# Patient Record
Sex: Female | Born: 1980 | Race: Black or African American | Hispanic: No | Marital: Married | Smoking: Current every day smoker
Health system: Southern US, Community
[De-identification: ages and names within clinical notes are randomized; demographics above are authoritative.]

---

## 2006-03-20 ENCOUNTER — Emergency Department: Payer: Self-pay | Admitting: Emergency Medicine

## 2006-10-19 ENCOUNTER — Emergency Department: Payer: Self-pay | Admitting: Emergency Medicine

## 2006-11-07 ENCOUNTER — Observation Stay: Payer: Self-pay | Admitting: Obstetrics and Gynecology

## 2007-02-13 ENCOUNTER — Observation Stay: Payer: Self-pay | Admitting: Obstetrics & Gynecology

## 2007-03-03 ENCOUNTER — Inpatient Hospital Stay: Payer: Self-pay

## 2007-08-09 ENCOUNTER — Emergency Department: Payer: Self-pay | Admitting: Emergency Medicine

## 2007-12-12 ENCOUNTER — Emergency Department: Payer: Self-pay | Admitting: Emergency Medicine

## 2008-01-12 ENCOUNTER — Emergency Department: Payer: Self-pay | Admitting: Emergency Medicine

## 2008-03-05 ENCOUNTER — Emergency Department: Payer: Self-pay | Admitting: Internal Medicine

## 2008-03-25 ENCOUNTER — Emergency Department: Payer: Self-pay | Admitting: Emergency Medicine

## 2008-10-12 ENCOUNTER — Emergency Department: Payer: Self-pay | Admitting: Emergency Medicine

## 2008-11-04 ENCOUNTER — Emergency Department: Payer: Self-pay | Admitting: Emergency Medicine

## 2009-01-12 ENCOUNTER — Emergency Department: Payer: Self-pay | Admitting: Unknown Physician Specialty

## 2009-03-30 ENCOUNTER — Emergency Department: Payer: Self-pay | Admitting: Emergency Medicine

## 2010-05-19 ENCOUNTER — Emergency Department: Payer: Self-pay | Admitting: Unknown Physician Specialty

## 2010-09-21 ENCOUNTER — Emergency Department: Payer: Self-pay | Admitting: Emergency Medicine

## 2010-09-23 ENCOUNTER — Emergency Department: Payer: Self-pay | Admitting: Internal Medicine

## 2011-01-05 ENCOUNTER — Emergency Department: Payer: Self-pay | Admitting: Emergency Medicine

## 2011-01-31 ENCOUNTER — Emergency Department: Payer: Self-pay | Admitting: Emergency Medicine

## 2011-07-10 ENCOUNTER — Observation Stay: Payer: Self-pay

## 2011-07-10 LAB — URINALYSIS, COMPLETE
Bilirubin,UR: NEGATIVE
Protein: NEGATIVE
Specific Gravity: 1.021 (ref 1.003–1.030)
Squamous Epithelial: 1

## 2011-08-12 ENCOUNTER — Observation Stay: Payer: Self-pay

## 2011-08-12 LAB — PIH PROFILE
Calcium, Total: 8.3 mg/dL — ABNORMAL LOW (ref 8.5–10.1)
Chloride: 106 mmol/L (ref 98–107)
Co2: 22 mmol/L (ref 21–32)
Creatinine: 0.58 mg/dL — ABNORMAL LOW (ref 0.60–1.30)
Glucose: 72 mg/dL (ref 65–99)
HCT: 34.3 % — ABNORMAL LOW (ref 35.0–47.0)
HGB: 11.6 g/dL — ABNORMAL LOW (ref 12.0–16.0)
MCH: 27.5 pg (ref 26.0–34.0)
MCHC: 33.9 g/dL (ref 32.0–36.0)
MCV: 81 fL (ref 80–100)
Osmolality: 269 (ref 275–301)
Platelet: 158 10*3/uL (ref 150–440)
Potassium: 3.7 mmol/L (ref 3.5–5.1)
RDW: 13.8 % (ref 11.5–14.5)
Uric Acid: 3.6 mg/dL (ref 2.6–6.0)

## 2011-08-12 LAB — PROTEIN / CREATININE RATIO, URINE
Creatinine, Urine: 180.8 mg/dL — ABNORMAL HIGH (ref 30.0–125.0)
Protein/Creat. Ratio: 100 mg/gCREAT (ref 0–200)

## 2011-08-24 ENCOUNTER — Inpatient Hospital Stay: Payer: Self-pay

## 2011-08-24 LAB — PIH PROFILE
Anion Gap: 12 (ref 7–16)
Calcium, Total: 8.3 mg/dL — ABNORMAL LOW (ref 8.5–10.1)
Creatinine: 0.67 mg/dL (ref 0.60–1.30)
EGFR (African American): >60
EGFR (Non-African Amer.): >60
Glucose: 105 mg/dL — ABNORMAL HIGH (ref 65–99)
HGB: 11.7 g/dL — ABNORMAL LOW (ref 12.0–16.0)
MCH: 26.9 pg (ref 26.0–34.0)
MCHC: 33.4 g/dL (ref 32.0–36.0)
MCV: 81 fL (ref 80–100)
Osmolality: 278 (ref 275–301)
Platelet: 146 10*3/uL — ABNORMAL LOW (ref 150–440)
Potassium: 3.6 mmol/L (ref 3.5–5.1)
RBC: 4.34 10*6/uL (ref 3.80–5.20)
RDW: 14.3 % (ref 11.5–14.5)
SGOT(AST): 20 U/L (ref 15–37)
WBC: 13.3 10*3/uL — ABNORMAL HIGH (ref 3.6–11.0)

## 2011-08-24 LAB — PROTEIN / CREATININE RATIO, URINE
Creatinine, Urine: 218 mg/dL — ABNORMAL HIGH (ref 30.0–125.0)
Protein, Random Urine: 41 mg/dL — ABNORMAL HIGH (ref 0–12)
Protein/Creat. Ratio: 188 mg/gCREAT (ref 0–200)

## 2011-08-27 LAB — PATHOLOGY REPORT

## 2012-09-16 ENCOUNTER — Emergency Department: Payer: Self-pay | Admitting: Internal Medicine

## 2012-09-30 ENCOUNTER — Emergency Department: Payer: Self-pay | Admitting: Emergency Medicine

## 2012-09-30 LAB — URINALYSIS, COMPLETE
Bilirubin,UR: NEGATIVE
Glucose,UR: NEGATIVE mg/dL (ref 0–75)
Hyaline Cast: 2
Squamous Epithelial: 10

## 2012-09-30 LAB — GC/CHLAMYDIA PROBE AMP

## 2012-09-30 LAB — WET PREP, GENITAL

## 2012-09-30 LAB — PREGNANCY, URINE: Pregnancy Test, Urine: NEGATIVE m[IU]/mL

## 2012-10-04 LAB — URINE CULTURE

## 2013-03-15 ENCOUNTER — Emergency Department: Payer: Self-pay | Admitting: Emergency Medicine

## 2013-03-26 ENCOUNTER — Emergency Department: Payer: Self-pay | Admitting: Emergency Medicine

## 2013-03-26 LAB — GC/CHLAMYDIA PROBE AMP

## 2013-03-26 LAB — URINALYSIS, COMPLETE
Bilirubin,UR: NEGATIVE
Blood: NEGATIVE
Glucose,UR: NEGATIVE mg/dL (ref 0–75)
Ketone: NEGATIVE
Nitrite: NEGATIVE
PH: 6 (ref 4.5–8.0)
Protein: NEGATIVE
RBC,UR: 3 /HPF (ref 0–5)
SPECIFIC GRAVITY: 1.027 (ref 1.003–1.030)
Squamous Epithelial: 2
WBC UR: 6 /HPF (ref 0–5)

## 2013-03-26 LAB — PREGNANCY, URINE: PREGNANCY TEST, URINE: NEGATIVE m[IU]/mL

## 2013-03-26 LAB — WET PREP, GENITAL

## 2013-03-28 LAB — URINE CULTURE

## 2013-04-28 ENCOUNTER — Emergency Department: Payer: Self-pay | Admitting: Emergency Medicine

## 2013-04-28 LAB — URINALYSIS, COMPLETE
Bilirubin,UR: NEGATIVE
GLUCOSE, UR: NEGATIVE mg/dL (ref 0–75)
Nitrite: NEGATIVE
PH: 6 (ref 4.5–8.0)
Protein: 100
Specific Gravity: 1.025 (ref 1.003–1.030)
Squamous Epithelial: >30

## 2013-04-28 LAB — COMPREHENSIVE METABOLIC PANEL
ALBUMIN: 3.6 g/dL (ref 3.4–5.0)
ALK PHOS: 50 U/L
ANION GAP: 7 (ref 7–16)
BILIRUBIN TOTAL: 0.5 mg/dL (ref 0.2–1.0)
BUN: 12 mg/dL (ref 7–18)
CALCIUM: 8.3 mg/dL — AB (ref 8.5–10.1)
CO2: 24 mmol/L (ref 21–32)
Chloride: 106 mmol/L (ref 98–107)
Creatinine: 0.8 mg/dL (ref 0.60–1.30)
EGFR (African American): >60
EGFR (Non-African Amer.): >60
Glucose: 120 mg/dL — ABNORMAL HIGH (ref 65–99)
Osmolality: 275 (ref 275–301)
POTASSIUM: 3.3 mmol/L — AB (ref 3.5–5.1)
SGOT(AST): 14 U/L — ABNORMAL LOW (ref 15–37)
SGPT (ALT): 20 U/L (ref 12–78)
Sodium: 137 mmol/L (ref 136–145)
TOTAL PROTEIN: 7 g/dL (ref 6.4–8.2)

## 2013-04-28 LAB — CBC
HCT: 38.4 % (ref 35.0–47.0)
HGB: 12.8 g/dL (ref 12.0–16.0)
MCH: 26.8 pg (ref 26.0–34.0)
MCHC: 33.2 g/dL (ref 32.0–36.0)
MCV: 81 fL (ref 80–100)
Platelet: 109 10*3/uL — ABNORMAL LOW (ref 150–440)
RBC: 4.76 10*6/uL (ref 3.80–5.20)
RDW: 14.8 % — ABNORMAL HIGH (ref 11.5–14.5)
WBC: 4.2 10*3/uL (ref 3.6–11.0)

## 2013-04-28 LAB — LIPASE, BLOOD: Lipase: 131 U/L (ref 73–393)

## 2013-04-28 LAB — TROPONIN I

## 2013-04-28 LAB — RAPID INFLUENZA A&B ANTIGENS

## 2013-04-30 LAB — URINE CULTURE

## 2013-05-31 ENCOUNTER — Emergency Department: Payer: Self-pay | Admitting: Emergency Medicine

## 2013-06-10 ENCOUNTER — Emergency Department: Payer: Self-pay | Admitting: Emergency Medicine

## 2013-06-10 LAB — COMPREHENSIVE METABOLIC PANEL
ANION GAP: 2 — AB (ref 7–16)
AST: 20 U/L (ref 15–37)
Albumin: 4.2 g/dL (ref 3.4–5.0)
Alkaline Phosphatase: 64 U/L
BUN: 14 mg/dL (ref 7–18)
Bilirubin,Total: 0.3 mg/dL (ref 0.2–1.0)
CO2: 29 mmol/L (ref 21–32)
Calcium, Total: 8.6 mg/dL (ref 8.5–10.1)
Chloride: 106 mmol/L (ref 98–107)
Creatinine: 0.74 mg/dL (ref 0.60–1.30)
EGFR (African American): >60
EGFR (Non-African Amer.): >60
GLUCOSE: 136 mg/dL — AB (ref 65–99)
Osmolality: 276 (ref 275–301)
POTASSIUM: 3.8 mmol/L (ref 3.5–5.1)
SGPT (ALT): 19 U/L (ref 12–78)
SODIUM: 137 mmol/L (ref 136–145)
TOTAL PROTEIN: 7.9 g/dL (ref 6.4–8.2)

## 2013-06-10 LAB — CBC WITH DIFFERENTIAL/PLATELET
Basophil #: 0 10*3/uL (ref 0.0–0.1)
Basophil %: 0.4 %
Eosinophil #: 0 10*3/uL (ref 0.0–0.7)
Eosinophil %: 0.2 %
HCT: 40.3 % (ref 35.0–47.0)
HGB: 12.7 g/dL (ref 12.0–16.0)
LYMPHS ABS: 0.9 10*3/uL — AB (ref 1.0–3.6)
LYMPHS PCT: 7.8 %
MCH: 26 pg (ref 26.0–34.0)
MCHC: 31.6 g/dL — ABNORMAL LOW (ref 32.0–36.0)
MCV: 82 fL (ref 80–100)
MONO ABS: 0.4 x10 3/mm (ref 0.2–0.9)
MONOS PCT: 3.3 %
Neutrophil #: 10.3 10*3/uL — ABNORMAL HIGH (ref 1.4–6.5)
Neutrophil %: 88.3 %
Platelet: 219 10*3/uL (ref 150–440)
RBC: 4.89 10*6/uL (ref 3.80–5.20)
RDW: 14.7 % — AB (ref 11.5–14.5)
WBC: 11.6 10*3/uL — ABNORMAL HIGH (ref 3.6–11.0)

## 2013-06-10 LAB — URINALYSIS, COMPLETE
BILIRUBIN, UR: NEGATIVE
Bacteria: NONE SEEN
Blood: NEGATIVE
Glucose,UR: NEGATIVE mg/dL (ref 0–75)
Leukocyte Esterase: NEGATIVE
Nitrite: NEGATIVE
PH: 6 (ref 4.5–8.0)
PROTEIN: NEGATIVE
RBC,UR: 1 /HPF (ref 0–5)
Specific Gravity: 1.019 (ref 1.003–1.030)
Squamous Epithelial: 5

## 2013-08-11 ENCOUNTER — Emergency Department: Payer: Self-pay | Admitting: Internal Medicine

## 2013-11-12 ENCOUNTER — Emergency Department: Payer: Self-pay | Admitting: Emergency Medicine

## 2013-11-12 LAB — COMPREHENSIVE METABOLIC PANEL
ALBUMIN: 3.6 g/dL (ref 3.4–5.0)
ALT: 15 U/L
ANION GAP: 8 (ref 7–16)
AST: 20 U/L (ref 15–37)
Alkaline Phosphatase: 56 U/L
BILIRUBIN TOTAL: 0.5 mg/dL (ref 0.2–1.0)
BUN: 6 mg/dL — AB (ref 7–18)
CALCIUM: 8.2 mg/dL — AB (ref 8.5–10.1)
CO2: 24 mmol/L (ref 21–32)
CREATININE: 0.82 mg/dL (ref 0.60–1.30)
Chloride: 107 mmol/L (ref 98–107)
EGFR (African American): >60
EGFR (Non-African Amer.): >60
GLUCOSE: 107 mg/dL — AB (ref 65–99)
Osmolality: 276 (ref 275–301)
POTASSIUM: 3.3 mmol/L — AB (ref 3.5–5.1)
Sodium: 139 mmol/L (ref 136–145)
TOTAL PROTEIN: 7.5 g/dL (ref 6.4–8.2)

## 2013-11-12 LAB — CBC
HCT: 39.4 % (ref 35.0–47.0)
HGB: 12.7 g/dL (ref 12.0–16.0)
MCH: 26.3 pg (ref 26.0–34.0)
MCHC: 32.3 g/dL (ref 32.0–36.0)
MCV: 82 fL (ref 80–100)
Platelet: 132 10*3/uL — ABNORMAL LOW (ref 150–440)
RBC: 4.83 10*6/uL (ref 3.80–5.20)
RDW: 15.1 % — AB (ref 11.5–14.5)
WBC: 5 10*3/uL (ref 3.6–11.0)

## 2013-11-12 LAB — URINALYSIS, COMPLETE
BILIRUBIN, UR: NEGATIVE
GLUCOSE, UR: NEGATIVE mg/dL (ref 0–75)
LEUKOCYTE ESTERASE: NEGATIVE
NITRITE: NEGATIVE
Ph: 5 (ref 4.5–8.0)
Protein: 30
SPECIFIC GRAVITY: 1.021 (ref 1.003–1.030)
Squamous Epithelial: 5

## 2014-03-19 ENCOUNTER — Emergency Department: Payer: Self-pay | Admitting: Emergency Medicine

## 2014-05-16 ENCOUNTER — Emergency Department (HOSPITAL_COMMUNITY)
Admission: EM | Admit: 2014-05-16 | Discharge: 2014-05-16 | Disposition: A | Discharge status: Home / Self Care | Payer: No Typology Code available for payment source | Attending: Emergency Medicine | Admitting: Emergency Medicine

## 2014-05-16 ENCOUNTER — Encounter (HOSPITAL_COMMUNITY): Payer: Self-pay | Admitting: Emergency Medicine

## 2014-05-16 DIAGNOSIS — Y9389 Activity, other specified: Secondary | ICD-10-CM | POA: Diagnosis not present

## 2014-05-16 DIAGNOSIS — M545 Low back pain, unspecified: Secondary | ICD-10-CM

## 2014-05-16 DIAGNOSIS — R030 Elevated blood-pressure reading, without diagnosis of hypertension: Secondary | ICD-10-CM | POA: Insufficient documentation

## 2014-05-16 DIAGNOSIS — Y92410 Unspecified street and highway as the place of occurrence of the external cause: Secondary | ICD-10-CM | POA: Diagnosis not present

## 2014-05-16 DIAGNOSIS — R51 Headache: Secondary | ICD-10-CM

## 2014-05-16 DIAGNOSIS — S3992XA Unspecified injury of lower back, initial encounter: Secondary | ICD-10-CM | POA: Diagnosis not present

## 2014-05-16 DIAGNOSIS — R519 Headache, unspecified: Secondary | ICD-10-CM

## 2014-05-16 DIAGNOSIS — S0990XA Unspecified injury of head, initial encounter: Secondary | ICD-10-CM | POA: Diagnosis not present

## 2014-05-16 DIAGNOSIS — IMO0001 Reserved for inherently not codable concepts without codable children: Secondary | ICD-10-CM

## 2014-05-16 DIAGNOSIS — Y998 Other external cause status: Secondary | ICD-10-CM | POA: Diagnosis not present

## 2014-05-16 DIAGNOSIS — S199XXA Unspecified injury of neck, initial encounter: Secondary | ICD-10-CM | POA: Diagnosis present

## 2014-05-16 DIAGNOSIS — M542 Cervicalgia: Secondary | ICD-10-CM

## 2014-05-16 MED ORDER — CYCLOBENZAPRINE HCL 10 MG PO TABS: 10.0000 mg | ORAL_TABLET | Freq: Two times a day (BID) | ORAL | Status: DC | PRN

## 2014-05-16 MED ORDER — TRAMADOL HCL 50 MG PO TABS: 50.0000 mg | ORAL_TABLET | Freq: Four times a day (QID) | ORAL | Status: DC | PRN

## 2014-05-16 MED ORDER — DIAZEPAM 5 MG PO TABS
5.0000 mg | ORAL_TABLET | Freq: Once | ORAL | Status: AC
  Administered 2014-05-16: 5 mg via ORAL
  Filled 2014-05-16: qty 1

## 2014-05-16 MED ORDER — TRAMADOL HCL 50 MG PO TABS
50.0000 mg | ORAL_TABLET | Freq: Once | ORAL | Status: AC
  Administered 2014-05-16: 50 mg via ORAL
  Filled 2014-05-16: qty 1

## 2014-05-16 MED ORDER — NAPROXEN 500 MG PO TABS: 500.0000 mg | ORAL_TABLET | Freq: Two times a day (BID) | ORAL | Status: DC
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
## 2014-07-17 NOTE — Op Note (Signed)
PATIENT NAME:  Victoria GobbleBRYANT, Shiree N MR#:  [id number] DATE OF BIRTH:  1980/08/14  DATE OF PROCEDURE:  08/25/2011  PREOPERATIVE DIAGNOSIS: Desires sterility.   POSTOPERATIVE DIAGNOSIS: Desires sterility.   PROCEDURE: Bilateral partial salpingectomy modified [place] method.   SURGEON: Janaisa Birkland A. Weaver-Lee, MD  ANESTHESIA: General.   ESTIMATED BLOOD LOSS: Minimal.   OPERATIVE FLUIDS: 400 mL.   COMPLICATIONS: None.   FINDINGS: Normal appearing tubes.   SPECIMEN: Portion of right and left tube.   INDICATIONS: Patient at 34 year old who is status post normal vaginal delivery who desired permanent sterilization. Risks, benefits, indications, and alternatives of the procedure were explained and informed consent was obtained.   DESCRIPTION OF PROCEDURE: Patient was taken to the Operating Room with IV fluids running. She was prepped and draped in the usual sterile fashion in the supine position. The infraumbilical region was injected with 0.5% bupivacaine. The region was grasped with Allis clamps and an infraumbilical horizontal incision was made. The fascia was grasped with Kocher and was entered sharply. The peritoneum was entered bluntly. Two pieces of #0 Vicryl suture was placed on the fascia on either side of the incision and held in place for repair of the fascia at the end of the case. The peritoneum had been entered bluntly. The patient's left tube was identified, followed out to the fimbriated end and grasped approximately 2 to 3 cm lateral to the uterine cornu with a Babcock. An opening was made in an avascular window in the mesosalpinx. Two pieces of plain gut suture were passed through this opening and the tube was tied 2 to 3 cm lateral to the uterine cornu. The tube was cut. The cut edges were made hemostatic and the tube was returned to the abdomen. This was repeated on the patient's right tube. The previously placed #0 Vicryl sutures were used to close the fascia. The skin of the incision  was closed with a 4-0 in a subcuticular fashion. Patient tolerated procedure well. Sponge, needle and instrument counts were correct x2 and the patient was awakened from anesthesia and taken to recovery room in stable condition.    ____________________________ Sonda PrimesLashawn A. Patton SallesWeaver-Lee, MD law:cms D: 08/25/2011 09:04:34 ET T: 08/25/2011 11:26:39 ET JOB#: [id number]  cc: Flint MelterLashawn A. Patton SallesWeaver-Lee, MD, <Dictator> Sonda PrimesLASHAWN A WEAVER LEE MD ELECTRONICALLY SIGNED 08/27/2011 15:29

## 2014-08-02 NOTE — H&P (Signed)
L&D Evaluation:  History:   HPI precipitous labor    Presents with contractions   Electronic Signatures: Lorrene ReidStaebler, Catriona Dillenbeck M (MD)  (Signed 01-Jun-13 06:20)  Authored: L&D Evaluation   Last Updated: 01-Jun-13 06:20 by Lorrene ReidStaebler, Ibeth Fahmy M (MD)

## 2014-08-02 NOTE — H&P (Signed)
L&D Evaluation:  History:   HPI 34 year old [id number] sent to L&D from office earlier today due to increased BP in office. 38 weeks today. Had PIH lab work done about 4 days ago that was normal. [facility], obesity, Rh negative, BV x1, UTI x1, and weekly progesterone injections due to hx of PROM. Ob hx of twin delivery at 21 weeks due to PROM, then 2 term SVD's which she rec'd 17P injections during both pregnancies. Desires PP BTL and papers have been signed    Presents with nausea/vomiting, PIH eval    Patient's Medical History depression/anxiety    Patient's Surgical History none    Medications Pre Natal Vitamins    Allergies Septra    Social History tobacco  MJ in past    Family History Non-Contributory   ROS:   ROS see HPI   Exam:   Vital Signs stable    Urine Protein P/C ratio 100    General no apparent distress    Mental Status clear    Abdomen gravid, non-tender    Back no CVAT    Edema no edema    Pelvic deferred for now- 3 cm at office per pt    Mebranes Intact    FHT normal rate with no decels    Ucx irregular    Skin dry   Impression:   Impression evaluation for PIH, reactive NST, 38 weeks   Plan:   Plan EFM/NST, monitor BP, PIH panel, discharge    Comments P/C ratio 100, all PIH labs WNL BP's all normal DC to home, preE precautions. Appt at office on Thursday with CLG.   Electronic Signatures: Shella Maximutnam, Shykeem Resurreccion (CNM)  (Signed 20-May-13 20:51)  Authored: L&D Evaluation   Last Updated: 20-May-13 20:51 by Shella MaximPutnam, Finch Costanzo (CNM)

## 2014-08-02 NOTE — H&P (Signed)
L&D Evaluation:  History:   HPI 34 year old [id number] presents to L&D at 33 weeks 2 days with c/o cramping, back pain, decreased fetal movement, and nausea and vomiting. [facility] to care, obesity, Rh negative, BV x1, UTI x1, and weekly progesterone injections. Ob hx of twin delivery at 21 weeks due to PROM, then 2 term SVD's which she rec'd 17P injections during both pregnancies. Pt states that she woke up with nausea and vomiting this AM, and has vomited a lot throughout the day, then started having cramping and back pain today.  Desires PP BTL and papers have been signed    Presents with abdominal pain, back pain, decreased fetal movement, nausea/vomiting    Patient's Medical History depression/anxiety    Patient's Surgical History none    Medications Pre Natal Vitamins  17P progesterone injections    Allergies Septra    Social History tobacco  MJ in past    Family History Non-Contributory   ROS:   ROS see HPI   Exam:   Vital Signs stable    General no apparent distress    Mental Status clear    Abdomen gravid, non-tender    Back no CVAT    Edema no edema    Pelvic deferred for now    Mebranes Intact    FHT normal rate with no decels, some minimal variability    Ucx irritability    Skin dry   Impression:   Impression dehydration, decreased fetal movement, 33 weeks, nausea/vomiting   Plan:   Plan UA, EFM/NST, monitor contractions and for cervical change, IV hydration, zofran IV, terbutiline x1 SQ   Electronic Signatures: Shella MaximPutnam, Kalesha Irving (CNM)  (Signed 17-Apr-13 20:27)  Authored: L&D Evaluation   Last Updated: 17-Apr-13 20:27 by Shella MaximPutnam, Donni Oglesby (CNM)

## 2015-12-07 ENCOUNTER — Emergency Department: Payer: Medicaid Other

## 2015-12-07 ENCOUNTER — Encounter: Payer: Self-pay | Admitting: *Deleted

## 2015-12-07 ENCOUNTER — Emergency Department
Admission: EM | Admit: 2015-12-07 | Discharge: 2015-12-07 | Disposition: A | Discharge status: Home / Self Care | Payer: Medicaid Other | Attending: Emergency Medicine | Admitting: Emergency Medicine

## 2015-12-07 DIAGNOSIS — F17200 Nicotine dependence, unspecified, uncomplicated: Secondary | ICD-10-CM | POA: Insufficient documentation

## 2015-12-07 DIAGNOSIS — Y939 Activity, unspecified: Secondary | ICD-10-CM | POA: Insufficient documentation

## 2015-12-07 DIAGNOSIS — S99922A Unspecified injury of left foot, initial encounter: Secondary | ICD-10-CM | POA: Diagnosis present

## 2015-12-07 DIAGNOSIS — Y999 Unspecified external cause status: Secondary | ICD-10-CM | POA: Diagnosis not present

## 2015-12-07 DIAGNOSIS — S90812A Abrasion, left foot, initial encounter: Secondary | ICD-10-CM | POA: Diagnosis not present

## 2015-12-07 DIAGNOSIS — M25572 Pain in left ankle and joints of left foot: Secondary | ICD-10-CM

## 2015-12-07 DIAGNOSIS — Y929 Unspecified place or not applicable: Secondary | ICD-10-CM | POA: Insufficient documentation

## 2015-12-07 DIAGNOSIS — W108XXA Fall (on) (from) other stairs and steps, initial encounter: Secondary | ICD-10-CM | POA: Insufficient documentation

## 2015-12-07 MED ORDER — NAPROXEN 500 MG PO TABS: 500.0000 mg | ORAL_TABLET | Freq: Two times a day (BID) | ORAL | 0 refills | Status: DC

## 2015-12-07 MED ORDER — TRAMADOL HCL 50 MG PO TABS: 50.0000 mg | ORAL_TABLET | Freq: Four times a day (QID) | ORAL | 0 refills | Status: DC | PRN

## 2015-12-07 NOTE — ED Notes (Signed)
Discharge instructions reviewed with patient. Patient verbalized understanding. Patient ambulated to lobby without difficulty.   

## 2015-12-07 NOTE — ED Notes (Signed)
Provider in to see, assess and discharge pt prior to RN. Please see provider note for assessment.

## 2015-12-07 NOTE — ED Provider Notes (Signed)
[facility] Emergency Department Provider Note ____________________________________________  Time seen: Approximately 8:31 PM  I have reviewed the triage vital signs and the nursing notes.   HISTORY  Chief Complaint Ankle Pain    HPI Victoria Hardy is a 35 y.o. female who presents to the emergency department for evaluation of left ankle pain after falling down 3 steps. She has an abrasion to the top of the left foot and some swelling as well.  No past medical history on file.  There are no active problems to display for this patient.   No past surgical history on file.  Prior to Admission medications   Medication Sig Start Date End Date Taking? Authorizing Provider  naproxen (NAPROSYN) 500 MG tablet Take 1 tablet (500 mg total) by mouth 2 (two) times daily with a meal. 12/07/15   Markiesha Delia B Balin Vandegrift, FNP  traMADol (ULTRAM) 50 MG tablet Take 1 tablet (50 mg total) by mouth every 6 (six) hours as needed. 12/07/15   Chinita Pesterari B Annayah Worthley, FNP    Allergies Sulfa antibiotics and Chocolate  No family history on file.  Social History Social History  Substance Use Topics  . Smoking status: Current Every Day Smoker  . Smokeless tobacco: Never Used  . Alcohol use No    Review of Systems Constitutional: No recent illness. Cardiovascular: Denies chest pain or palpitations. Respiratory: Denies shortness of breath. Musculoskeletal: Pain in Left ankle Skin: Negative for rash, wound, lesion. Neurological: Negative for focal weakness or numbness.  ____________________________________________   PHYSICAL EXAM:  VITAL SIGNS: ED Triage Vitals  Enc Vitals Group     BP 12/07/15 1937 (!) 161/83     Pulse Rate 12/07/15 1933 90     Resp 12/07/15 1933 18     Temp 12/07/15 1933 98.5 F (36.9 C)     Temp Source 12/07/15 1933 Oral     SpO2 12/07/15 1933 99 %     Weight 12/07/15 1935 215 lb (97.5 kg)     Height 12/07/15 1935 5\' 7"  (1.702 m)     Head Circumference --       Peak Flow --      Pain Score 12/07/15 1935 9     Pain Loc --      Pain Edu? --      Excl. in GC? --     Constitutional: Alert and oriented. Well appearing and in no acute distress. Eyes: Conjunctivae are normal. EOMI. Head: Atraumatic. Neck: No stridor.  Respiratory: Normal respiratory effort.   Musculoskeletal: ATFL pattern tenderness and swellint of the left foot. [place] ankle rules are negative. Neurologic:  Normal speech and language. No gross focal neurologic deficits are appreciated. Speech is normal. No gait instability. Skin:  Skin is warm, dry. Superficial abrasion to the dorsal aspect of the left foot. Psychiatric: Mood and affect are normal. Speech and behavior are normal.  ____________________________________________   LABS (all labs ordered are listed, but only abnormal results are displayed)  Labs Reviewed - No data to display ____________________________________________  RADIOLOGY  Left ankle negative for acute bony abnormality per radiology. ____________________________________________   PROCEDURES  Procedure(s) performed: Velcro ankle stirrup splint applied by ER tech. Left foot and ankle neurovascularly unchanged after application.   ____________________________________________   INITIAL IMPRESSION / ASSESSMENT AND PLAN / ED COURSE  Clinical Course    Pertinent labs & imaging results that were available during my care of the patient were reviewed by me and considered in my medical decision making (see  chart for details).  Patient was instructed to follow-up with podiatry for symptoms that are not improving over the week. She was given prescriptions for Naprosyn and tramadol. She is to return to the emergency department for symptoms that change or worsen if she is unable schedule an appointment. ____________________________________________   FINAL CLINICAL IMPRESSION(S) / ED DIAGNOSES  Final diagnoses:  Left ankle pain       Chinita Pester, FNP 12/07/15 2044    Minna Antis, MD 12/07/15 2308

## 2015-12-07 NOTE — ED Triage Notes (Signed)
Pt fell down 3 steps and has right ankle pain.  Abrasion to to top of right foot.  Swelling noted.

## 2017-10-22 ENCOUNTER — Encounter: Payer: Self-pay | Admitting: Emergency Medicine

## 2017-10-22 ENCOUNTER — Emergency Department: Payer: Self-pay

## 2017-10-22 ENCOUNTER — Other Ambulatory Visit: Payer: Self-pay

## 2017-10-22 ENCOUNTER — Emergency Department
Admission: EM | Admit: 2017-10-22 | Discharge: 2017-10-22 | Disposition: A | Discharge status: Home / Self Care | Payer: Self-pay | Attending: Emergency Medicine | Admitting: Emergency Medicine

## 2017-10-22 DIAGNOSIS — F17210 Nicotine dependence, cigarettes, uncomplicated: Secondary | ICD-10-CM | POA: Insufficient documentation

## 2017-10-22 DIAGNOSIS — M545 Low back pain, unspecified: Secondary | ICD-10-CM

## 2017-10-22 LAB — POCT PREGNANCY, URINE: PREG TEST UR: NEGATIVE

## 2017-10-22 LAB — URINALYSIS, COMPLETE (UACMP) WITH MICROSCOPIC
BILIRUBIN URINE: NEGATIVE
Glucose, UA: NEGATIVE mg/dL
HGB URINE DIPSTICK: NEGATIVE
Ketones, ur: NEGATIVE mg/dL
LEUKOCYTES UA: NEGATIVE
NITRITE: NEGATIVE
PH: 7 (ref 5.0–8.0)
Protein, ur: NEGATIVE mg/dL
Specific Gravity, Urine: 1.018 (ref 1.005–1.030)

## 2017-10-22 MED ORDER — KETOROLAC TROMETHAMINE 30 MG/ML IJ SOLN
30.0000 mg | Freq: Once | INTRAMUSCULAR | Status: AC
  Administered 2017-10-22: 30 mg via INTRAMUSCULAR
  Filled 2017-10-22: qty 1

## 2017-10-22 MED ORDER — CYCLOBENZAPRINE HCL 5 MG PO TABS: ORAL_TABLET | ORAL | 0 refills | Status: DC

## 2017-10-22 MED ORDER — KETOROLAC TROMETHAMINE 10 MG PO TABS: 10.0000 mg | ORAL_TABLET | Freq: Four times a day (QID) | ORAL | 0 refills | Status: DC | PRN

## 2017-10-22 MED ORDER — LIDOCAINE 5 % EX PTCH: 1.0000 | MEDICATED_PATCH | CUTANEOUS | 0 refills | Status: DC

## 2017-10-22 NOTE — ED Provider Notes (Signed)
[facility] Emergency Department Provider Note  ____________________________________________  Time seen: Approximately 10:24 AM  I have reviewed the triage vital signs and the nursing notes.   HISTORY  Chief Complaint Back Pain    HPI Victoria Hardy is a 37 y.o. female that presents emergency department for evaluation of low back pain worsening the last couple of days.  Patient states that she had a epidural 11 years ago and has had back pain on and off for the last 2 years.  She took ibuprofen yesterday, which has not helped.  No bowel or bladder dysfunction or saddle anesthesias.  No IV drug use. No trauma.  No fevers, nausea, vomiting, abdominal pain, dysuria, urgency, frequency.  History reviewed. No pertinent past medical history.  There are no active problems to display for this patient.   History reviewed. No pertinent surgical history.  Prior to Admission medications   Medication Sig Start Date End Date Taking? Authorizing Provider  cyclobenzaprine (FLEXERIL) 5 MG tablet Take 1-2 tablets 3 times daily as needed 10/22/17   Enid DerryWagner, Anikin Prosser, PA-C  ketorolac (TORADOL) 10 MG tablet Take 1 tablet (10 mg total) by mouth every 6 (six) hours as needed. 10/22/17   Enid DerryWagner, Earsel Shouse, PA-C  lidocaine (LIDODERM) 5 % Place 1 patch onto the skin daily. Remove & Discard patch within 12 hours or as directed by MD 10/22/17   Enid DerryWagner, Leiah Giannotti, PA-C  naproxen (NAPROSYN) 500 MG tablet Take 1 tablet (500 mg total) by mouth 2 (two) times daily with a meal. 12/07/15   Triplett, Cari B, FNP  traMADol (ULTRAM) 50 MG tablet Take 1 tablet (50 mg total) by mouth every 6 (six) hours as needed. 12/07/15   Triplett, Rulon Eisenmengerari B, FNP    Allergies Sulfa antibiotics and Chocolate  No family history on file.  Social History Social History   Tobacco Use  . Smoking status: Current Every Day Smoker    Packs/day: 0.50    Types: Cigarettes  . Smokeless tobacco: Never Used  Substance Use Topics   . Alcohol use: No  . Drug use: Not on file     Review of Systems  Constitutional: No fever/chills Gastrointestinal: No abdominal pain.  No nausea, no vomiting.  Musculoskeletal: Positive for back pain.  Skin: Negative for rash, abrasions, lacerations, ecchymosis. Neurological: Negative for headaches, numbness or tingling   ____________________________________________   PHYSICAL EXAM:  VITAL SIGNS: ED Triage Vitals  Enc Vitals Group     BP 10/22/17 0912 (!) 150/91     Pulse Rate 10/22/17 0912 78     Resp 10/22/17 0912 20     Temp 10/22/17 0912 97.8 F (36.6 C)     Temp Source 10/22/17 0912 Oral     SpO2 10/22/17 0912 100 %     Weight 10/22/17 0907 231 lb (104.8 kg)     Height 10/22/17 0907 5\' 7"  (1.702 m)     Head Circumference --      Peak Flow --      Pain Score 10/22/17 0907 7     Pain Loc --      Pain Edu? --      Excl. in GC? --      Constitutional: Alert and oriented. Well appearing and in no acute distress. Eyes: Conjunctivae are normal. PERRL. EOMI. Head: Atraumatic. ENT:      Ears:      Nose: No congestion/rhinnorhea.      Mouth/Throat: Mucous membranes are moist.  Neck: No stridor.  Cardiovascular: Normal rate, regular rhythm.  Good peripheral circulation. Respiratory: Normal respiratory effort without tachypnea or retractions. Lungs CTAB. Good air entry to the bases with no decreased or absent breath sounds. Gastrointestinal: Bowel sounds 4 quadrants. Soft and nontender to palpation. No guarding or rigidity. No palpable masses. No distention. No CVA tenderness. Musculoskeletal: Full range of motion to all extremities. No gross deformities appreciated.  Tenderness to palpation diffusely over lumbar spine and lumbar paraspinal muscles.  Negative straight leg raise. Normal gait.  Neurologic:  Normal speech and language. No gross focal neurologic deficits are appreciated.  Skin:  Skin is warm, dry and intact. No rash noted. Psychiatric: Mood and affect  are normal. Speech and behavior are normal. Patient exhibits appropriate insight and judgement.   ____________________________________________   LABS (all labs ordered are listed, but only abnormal results are displayed)  Labs Reviewed  URINALYSIS, COMPLETE (UACMP) WITH MICROSCOPIC - Abnormal; Notable for the following components:      Result Value   Color, Urine YELLOW (*)    APPearance CLEAR (*)    Bacteria, UA FEW (*)    All other components within normal limits  URINE CULTURE  POC URINE PREG, ED  POCT PREGNANCY, URINE   ____________________________________________  EKG   ____________________________________________  RADIOLOGY Lexine Baton, personally viewed and evaluated these images (plain radiographs) as part of my medical decision making, as well as reviewing the written report by the radiologist.  Dg Lumbar Spine 2-3 Views  Result Date: 10/22/2017 CLINICAL DATA:  Low back pain swelling and tightness for several years with increased severity over the past 2 months. Patient was unable to bend over this morning. EXAM: LUMBAR SPINE - 2-3 VIEW COMPARISON:  None in PACs FINDINGS: The lumbar vertebral bodies are preserved in height. The pedicles and transverse processes are intact. The disc space heights are reasonably well maintained though there is mild narrowing at L5-S1. There is no spondylolisthesis. There is no significant facet joint hypertrophy. The observed portions of the sacrum are normal. IMPRESSION: There is no acute or significant chronic bony abnormality of the lumbar spine. There is mild degenerative disc space narrowing at L5-S1. Electronically Signed   By: David  [place] M.D.   On: 10/22/2017 11:03    ____________________________________________    PROCEDURES  Procedure(s) performed:    Procedures    Medications  ketorolac (TORADOL) 30 MG/ML injection 30 mg (30 mg Intramuscular Given 10/22/17 1248)      ____________________________________________   INITIAL IMPRESSION / ASSESSMENT AND PLAN / ED COURSE  Pertinent labs & imaging results that were available during my care of the patient were reviewed by me and considered in my medical decision making (see chart for details).  Review of the [place] CSRS was performed in accordance of the [facility] prior to dispensing any controlled drugs.   Patient presented to the emergency department for evaluation of low back pain.  Vital signs and exam are reassuring.  Patient did have few bacteria in urine, which is likely contamination as there also was squamous cells.  She denies urinary symptoms. Urine will be sent for culture.  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  Patient is sleeping comfortably in room.  She is given Toradol for pain.  Patient drove herself to the ER.  Patient will be discharged home with prescriptions for Toradol, Flexeril, Lidoderm. Patient is to follow up with PCP as directed. Patient is given ED precautions to return to the ED for any worsening or new symptoms.  ____________________________________________  FINAL CLINICAL IMPRESSION(S) / ED DIAGNOSES  Final diagnoses:  Acute bilateral low back pain without sciatica      NEW MEDICATIONS STARTED DURING THIS VISIT:  ED Discharge Orders        Ordered    ketorolac (TORADOL) 10 MG tablet  Every 6 hours PRN     10/22/17 1223    cyclobenzaprine (FLEXERIL) 5 MG tablet     10/22/17 1223    lidocaine (LIDODERM) 5 %  Every 24 hours     10/22/17 1223          This chart was dictated using voice recognition software/Dragon. Despite best efforts to proofread, errors can occur which can change the meaning. Any change was purely unintentional.    Enid Derry, PA-C 10/22/17 1541    Arnaldo Natal, MD 10/23/17 [place]

## 2017-10-22 NOTE — ED Triage Notes (Signed)
Pt states constant lower back pain from epidural 11 years ago, states she usually treats with ibuprofen, but not effective at this time. Appears in NAD.

## 2019-07-05 IMAGING — CR DG LUMBAR SPINE 2-3V
3 series · 3 of 3 positions shown · non-contrast
Comparison: None in PACs

CLINICAL DATA: Low back pain swelling and tightness for several
years with increased severity over the past 2 months. Patient was
unable to bend over this morning.

EXAM:
LUMBAR SPINE - 2-3 VIEW

[l-spine ap]
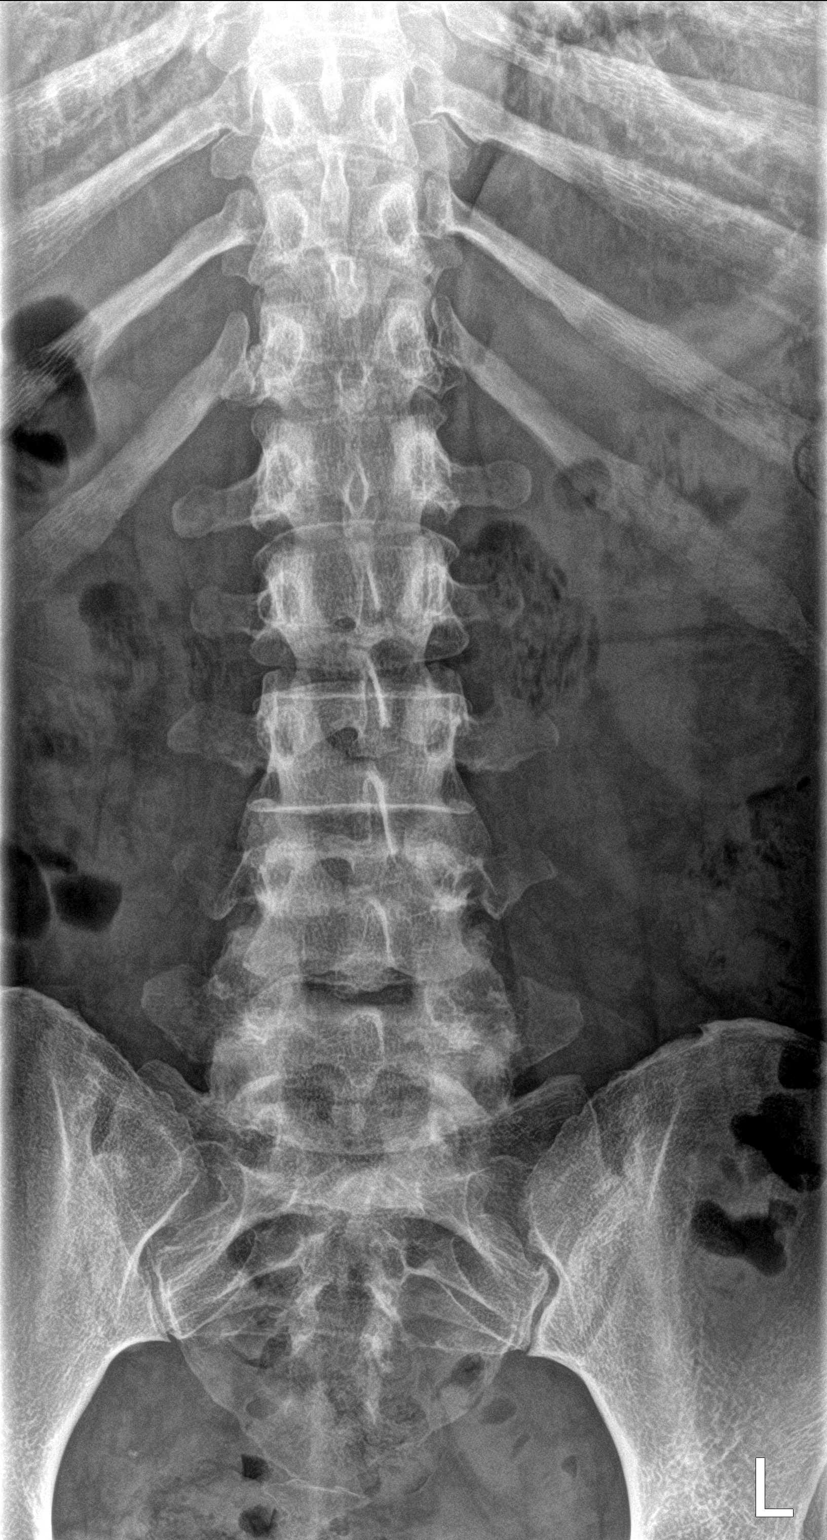

[l-spine lat]
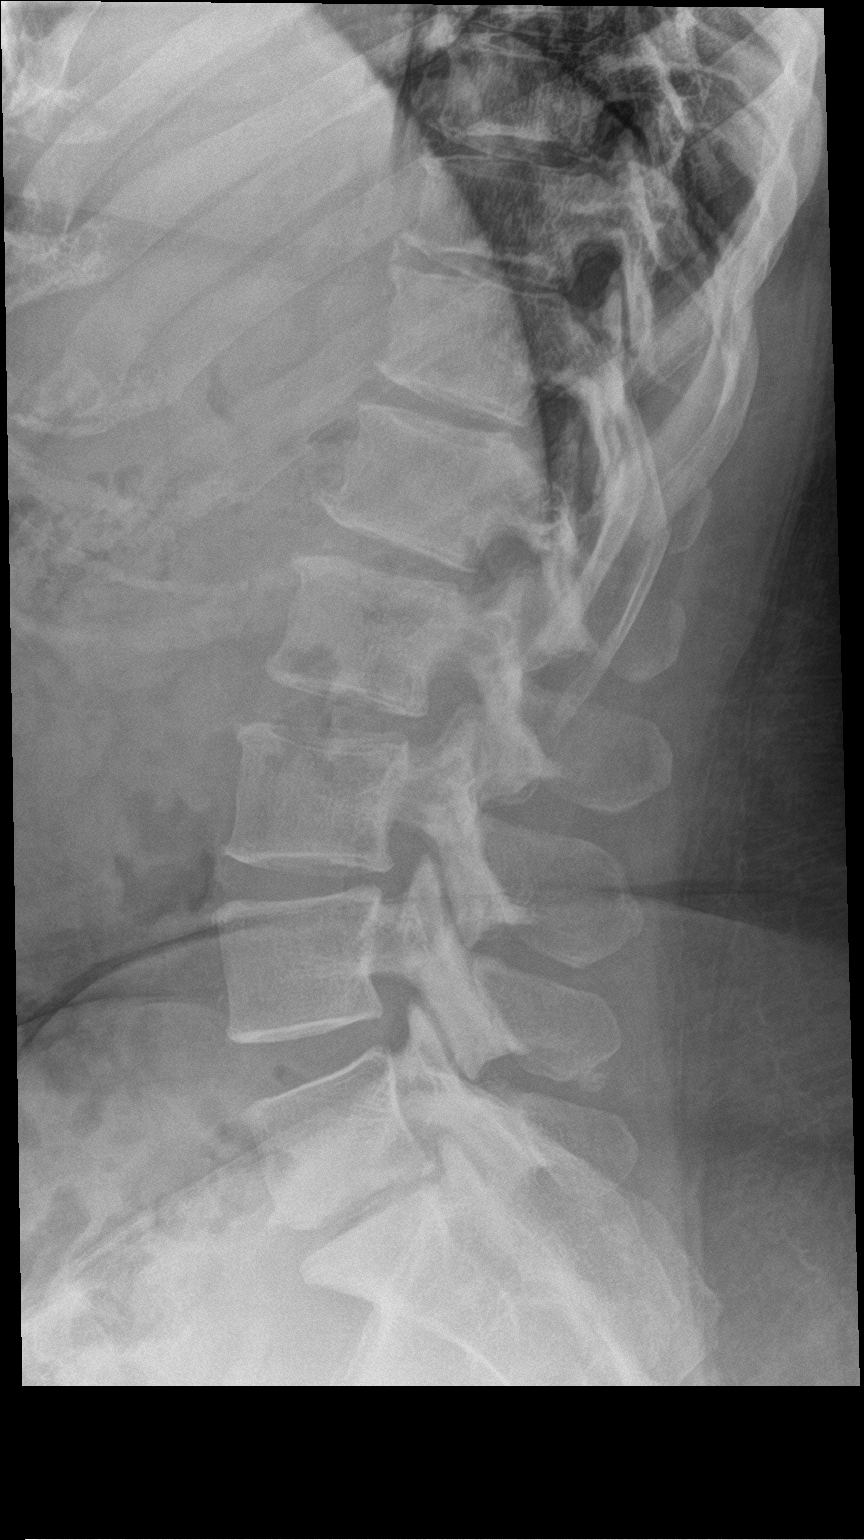

[l-spine spot]
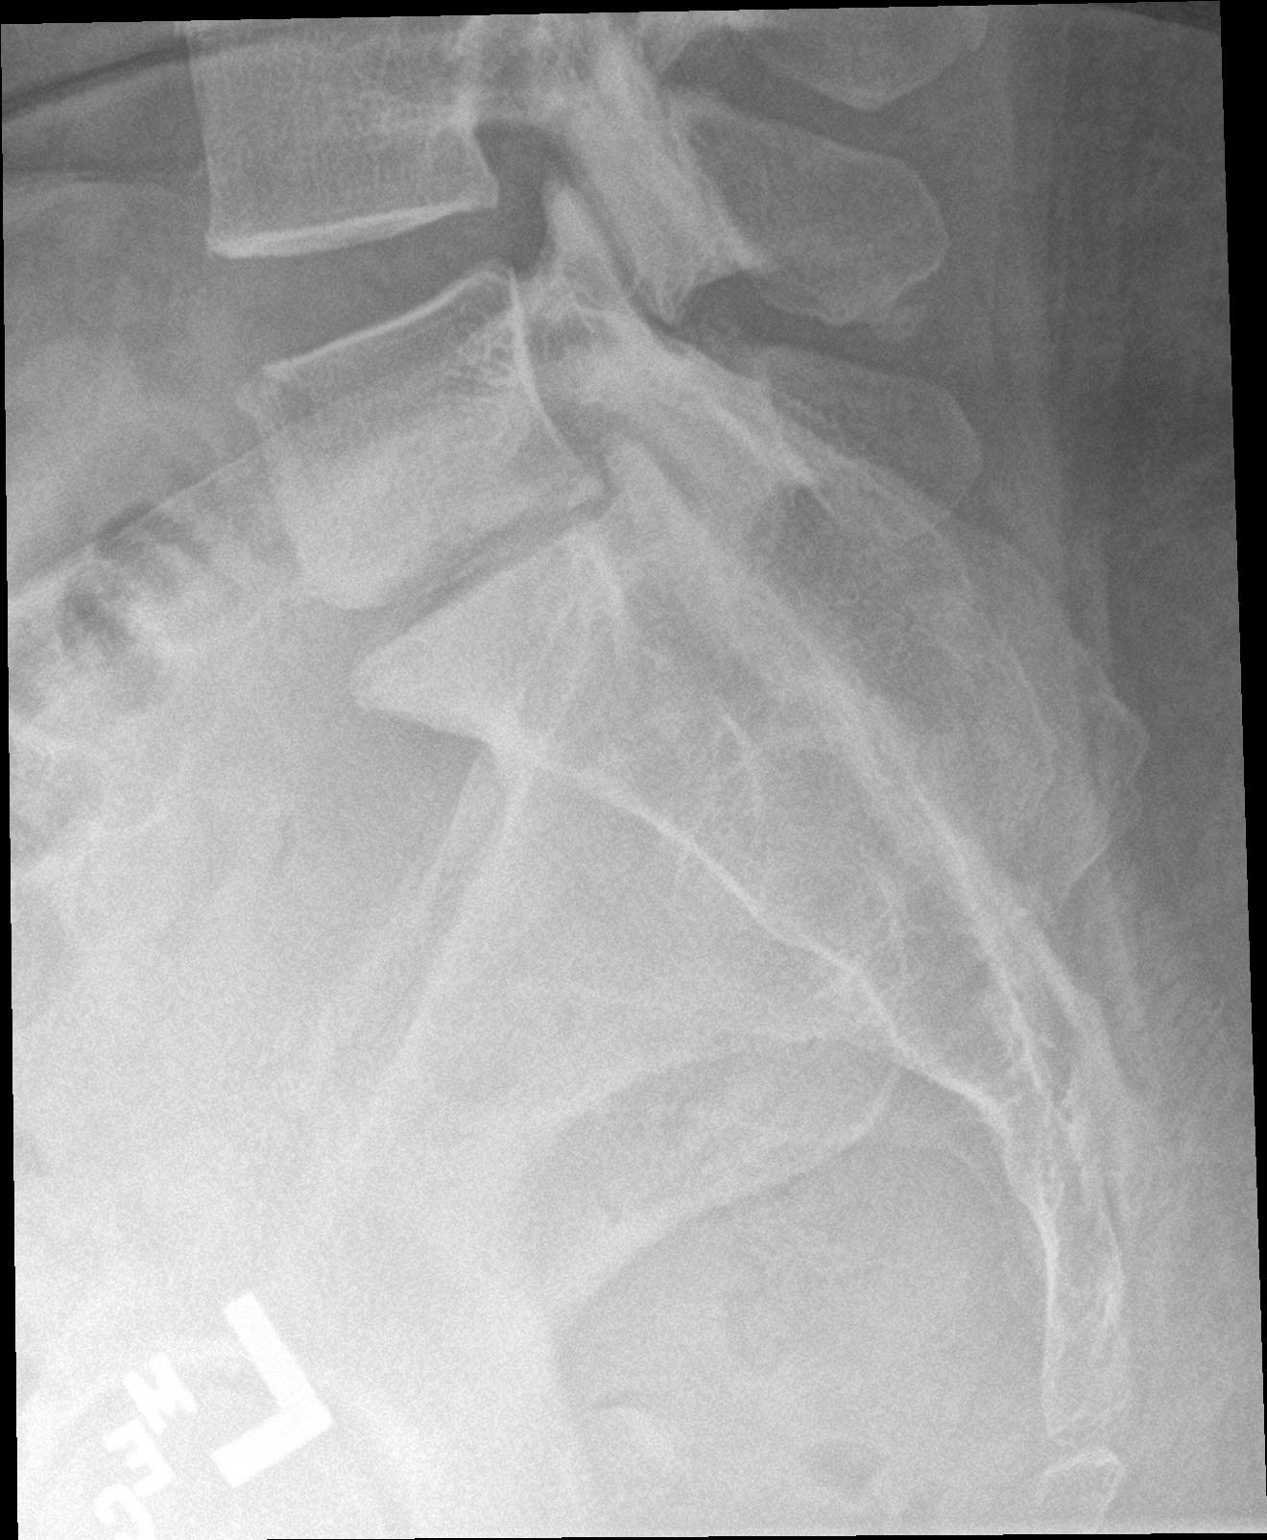

[3 of 3 positions shown; findings below may reference images not displayed]

FINDINGS: The lumbar vertebral bodies are preserved in height. The pedicles
and transverse processes are intact. The disc space heights are
reasonably well maintained though there is mild narrowing at L5-S1.
There is no spondylolisthesis. There is no significant facet joint
hypertrophy. The observed portions of the sacrum are normal.
IMPRESSION: There is no acute or significant chronic bony abnormality of the
lumbar spine. There is mild degenerative disc space narrowing at
L5-S1.

## 2019-12-11 ENCOUNTER — Emergency Department
Admission: EM | Admit: 2019-12-11 | Discharge: 2019-12-11 | Disposition: A | Discharge status: Home / Self Care | Payer: Self-pay | Attending: Emergency Medicine | Admitting: Emergency Medicine

## 2019-12-11 ENCOUNTER — Other Ambulatory Visit: Payer: Self-pay

## 2019-12-11 DIAGNOSIS — F17210 Nicotine dependence, cigarettes, uncomplicated: Secondary | ICD-10-CM | POA: Insufficient documentation

## 2019-12-11 DIAGNOSIS — N3001 Acute cystitis with hematuria: Secondary | ICD-10-CM | POA: Insufficient documentation

## 2019-12-11 LAB — COMPREHENSIVE METABOLIC PANEL
ALT: 9 U/L (ref 0–44)
AST: 14 U/L — ABNORMAL LOW (ref 15–41)
Albumin: 4.1 g/dL (ref 3.5–5.0)
Alkaline Phosphatase: 53 U/L (ref 38–126)
Anion gap: 10 (ref 5–15)
BUN: 14 mg/dL (ref 6–20)
CO2: 24 mmol/L (ref 22–32)
Calcium: 9 mg/dL (ref 8.9–10.3)
Chloride: 105 mmol/L (ref 98–111)
Creatinine, Ser: 0.74 mg/dL (ref 0.44–1.00)
GFR calc Af Amer: >60 mL/min (ref >60)
GFR calc non Af Amer: >60 mL/min (ref >60)
Glucose, Bld: 104 mg/dL — ABNORMAL HIGH (ref 70–99)
Potassium: 4.2 mmol/L (ref 3.5–5.1)
Sodium: 139 mmol/L (ref 135–145)
Total Bilirubin: 0.5 mg/dL (ref 0.3–1.2)
Total Protein: 7.6 g/dL (ref 6.5–8.1)

## 2019-12-11 LAB — URINALYSIS, COMPLETE (UACMP) WITH MICROSCOPIC
Bacteria, UA: NONE SEEN
Bilirubin Urine: NEGATIVE
Glucose, UA: NEGATIVE mg/dL
Ketones, ur: NEGATIVE mg/dL
Nitrite: NEGATIVE
Protein, ur: >=300 mg/dL — AB
RBC / HPF: >50 RBC/hpf — ABNORMAL HIGH (ref 0–5)
Specific Gravity, Urine: 1.023 (ref 1.005–1.030)
WBC, UA: >50 WBC/hpf — ABNORMAL HIGH (ref 0–5)
pH: 6 (ref 5.0–8.0)

## 2019-12-11 LAB — CBC
HCT: 34.7 % — ABNORMAL LOW (ref 36.0–46.0)
Hemoglobin: 10.8 g/dL — ABNORMAL LOW (ref 12.0–15.0)
MCH: 24.4 pg — ABNORMAL LOW (ref 26.0–34.0)
MCHC: 31.1 g/dL (ref 30.0–36.0)
MCV: 78.3 fL — ABNORMAL LOW (ref 80.0–100.0)
Platelets: 219 10*3/uL (ref 150–400)
RBC: 4.43 MIL/uL (ref 3.87–5.11)
RDW: 16.1 % — ABNORMAL HIGH (ref 11.5–15.5)
WBC: 8 10*3/uL (ref 4.0–10.5)
nRBC: 0 % (ref 0.0–0.2)

## 2019-12-11 LAB — LIPASE, BLOOD: Lipase: 32 U/L (ref 11–51)

## 2019-12-11 LAB — PREGNANCY, URINE: Preg Test, Ur: NEGATIVE

## 2019-12-11 MED ORDER — CEPHALEXIN 500 MG PO CAPS: 500.0000 mg | ORAL_CAPSULE | Freq: Four times a day (QID) | ORAL | 0 refills | Status: AC

## 2019-12-11 NOTE — ED Triage Notes (Signed)
Pt comes via POV from home with c/o UTI symptoms. Pt states this started Wednesday. Pt states hx of same. Pt states no relief with any medications.  Pt states pressure and urge to urinate.

## 2019-12-11 NOTE — ED Provider Notes (Signed)
[facility] Emergency Department Provider Note  ____________________________________________  Time seen: Approximately 11:28 AM  I have reviewed the triage vital signs and the nursing notes.   HISTORY  Chief Complaint UTI    HPI Victoria Hardy is a 39 y.o. female that presents to the emergency department for evaluation of urinary pressure and dysuria for 3 days.  Patient saw a small amount of blood in her urine this morning.  Patient has a history of urinary tract infections and this feels the same.  She states that symptoms started shortly after she had intercourse with her husband and did not urinate following.  No fever, nausea, vomiting, abdominal pain, back pain.  History reviewed. No pertinent past medical history.  There are no problems to display for this patient.   History reviewed. No pertinent surgical history.  Prior to Admission medications   Medication Sig Start Date End Date Taking? Authorizing Provider  cyclobenzaprine (FLEXERIL) 5 MG tablet Take 1-2 tablets 3 times daily as needed 10/22/17   Enid Derry, PA-C  ketorolac (TORADOL) 10 MG tablet Take 1 tablet (10 mg total) by mouth every 6 (six) hours as needed. 10/22/17   Enid Derry, PA-C  lidocaine (LIDODERM) 5 % Place 1 patch onto the skin daily. Remove & Discard patch within 12 hours or as directed by MD 10/22/17   Enid Derry, PA-C  naproxen (NAPROSYN) 500 MG tablet Take 1 tablet (500 mg total) by mouth 2 (two) times daily with a meal. 12/07/15   Triplett, Cari B, FNP  traMADol (ULTRAM) 50 MG tablet Take 1 tablet (50 mg total) by mouth every 6 (six) hours as needed. 12/07/15   Triplett, Rulon Eisenmenger B, FNP    Allergies Sulfa antibiotics and Chocolate  No family history on file.  Social History Social History   Tobacco Use  . Smoking status: Current Every Day Smoker    Packs/day: 0.50    Types: Cigarettes  . Smokeless tobacco: Never Used  Substance Use Topics  . Alcohol use: No   . Drug use: Not on file     Review of Systems  Constitutional: No fever/chills Cardiovascular: No chest pain. Respiratory: No SOB. Gastrointestinal: No abdominal pain.  No nausea, no vomiting.  Genitourinary: Positive for dysuria. Musculoskeletal: Negative for musculoskeletal pain. Skin: Negative for rash, abrasions, lacerations, ecchymosis. Neurological: Negative for headaches   ____________________________________________   PHYSICAL EXAM:  VITAL SIGNS: ED Triage Vitals  Enc Vitals Group     BP 12/11/19 1024 (!) 168/86     Pulse Rate 12/11/19 1024 81     Resp 12/11/19 1024 18     Temp 12/11/19 1024 98 F (36.7 C)     Temp Source 12/11/19 1024 Oral     SpO2 12/11/19 1024 100 %     Weight 12/11/19 0923 235 lb (106.6 kg)     Height 12/11/19 0923 5\' 7"  (1.702 m)     Head Circumference --      Peak Flow --      Pain Score 12/11/19 0923 5     Pain Loc --      Pain Edu? --      Excl. in GC? --      Constitutional: Alert and oriented. Well appearing and in no acute distress. Eyes: Conjunctivae are normal. PERRL. EOMI. Head: Atraumatic. ENT:      Ears:      Nose: No congestion/rhinnorhea.      Mouth/Throat: Mucous membranes are moist.  Neck: No stridor.  Cardiovascular: Normal rate, regular rhythm.  Good peripheral circulation. Respiratory: Normal respiratory effort without tachypnea or retractions. Lungs CTAB. Good air entry to the bases with no decreased or absent breath sounds. Gastrointestinal: Bowel sounds 4 quadrants. Soft and nontender to palpation. No guarding or rigidity. No palpable masses. No distention. No CVA tenderness. Musculoskeletal: Full range of motion to all extremities. No gross deformities appreciated. Neurologic:  Normal speech and language. No gross focal neurologic deficits are appreciated.  Skin:  Skin is warm, dry and intact. No rash noted. Psychiatric: Mood and affect are normal. Speech and behavior are normal. Patient exhibits  appropriate insight and judgement.   ____________________________________________   LABS (all labs ordered are listed, but only abnormal results are displayed)  Labs Reviewed  COMPREHENSIVE METABOLIC PANEL - Abnormal; Notable for the following components:      Result Value   Glucose, Bld 104 (*)    AST 14 (*)    All other components within normal limits  CBC - Abnormal; Notable for the following components:   Hemoglobin 10.8 (*)    HCT 34.7 (*)    MCV 78.3 (*)    MCH 24.4 (*)    RDW 16.1 (*)    All other components within normal limits  URINALYSIS, COMPLETE (UACMP) WITH MICROSCOPIC - Abnormal; Notable for the following components:   Color, Urine YELLOW (*)    APPearance TURBID (*)    Hgb urine dipstick LARGE (*)    Protein, ur >=300 (*)    Leukocytes,Ua MODERATE (*)    RBC / HPF >50 (*)    WBC, UA >50 (*)    All other components within normal limits  LIPASE, BLOOD  PREGNANCY, URINE   ____________________________________________  EKG   ____________________________________________  RADIOLOGY  No results found.  ____________________________________________    PROCEDURES  Procedure(s) performed:    Procedures    Medications - No data to display   ____________________________________________   INITIAL IMPRESSION / ASSESSMENT AND PLAN / ED COURSE  Pertinent labs & imaging results that were available during my care of the patient were reviewed by me and considered in my medical decision making (see chart for details).  Review of the [place] CSRS was performed in accordance of the [facility] prior to dispensing any controlled drugs.   Patient's diagnosis is consistent with cystitis.  Vital signs and exam are reassuring.  Lab work is largely unremarkable.  Urinalysis consistent with urinary tract infection.  Patient declines any imaging at this time.  She has good follow-up with primary care.  Patient will be discharged home with prescriptions for keflex. Patient is  to follow up with PCP as directed. Patient is given ED precautions to return to the ED for any worsening or new symptoms.   Victoria Hardy was evaluated in Emergency Department on 12/11/2019 for the symptoms described in the history of present illness. She was evaluated in the context of the global COVID-19 pandemic, which necessitated consideration that the patient might be at risk for infection with the SARS-CoV-2 virus that causes COVID-19. Institutional protocols and algorithms that pertain to the evaluation of patients at risk for COVID-19 are in a state of rapid change based on information released by regulatory bodies including the CDC and federal and state organizations. These policies and algorithms were followed during the patient's care in the ED.  ____________________________________________  FINAL CLINICAL IMPRESSION(S) / ED DIAGNOSES  Final diagnoses:  Acute cystitis with hematuria      NEW MEDICATIONS STARTED DURING THIS VISIT:  ED Discharge Orders    None          This chart was dictated using voice recognition software/Dragon. Despite best efforts to proofread, errors can occur which can change the meaning. Any change was purely unintentional.    Enid Derry, PA-C 12/11/19 1359    Merwyn Katos, MD 12/11/19 [phone]

## 2019-12-11 NOTE — ED Notes (Signed)
Pt presents to the ED for urinary frequency, dysuria, and burning when she pees since Wednesday. Pt states she gets recurrent UTIs. Pt came in today because she said she saw a small amount of blood in her urine. Pt A&Ox4 and NAD. VSS.

## 2020-04-05 ENCOUNTER — Encounter: Payer: Self-pay | Admitting: *Deleted

## 2020-04-05 ENCOUNTER — Other Ambulatory Visit: Payer: Self-pay

## 2020-04-05 DIAGNOSIS — R112 Nausea with vomiting, unspecified: Secondary | ICD-10-CM | POA: Insufficient documentation

## 2020-04-05 DIAGNOSIS — R0789 Other chest pain: Secondary | ICD-10-CM | POA: Insufficient documentation

## 2020-04-05 DIAGNOSIS — M791 Myalgia, unspecified site: Secondary | ICD-10-CM | POA: Insufficient documentation

## 2020-04-05 DIAGNOSIS — Z5321 Procedure and treatment not carried out due to patient leaving prior to being seen by health care provider: Secondary | ICD-10-CM | POA: Insufficient documentation

## 2020-04-05 LAB — COMPREHENSIVE METABOLIC PANEL
ALT: 12 U/L (ref 0–44)
AST: 18 U/L (ref 15–41)
Albumin: 4.3 g/dL (ref 3.5–5.0)
Alkaline Phosphatase: 58 U/L (ref 38–126)
Anion gap: 14 (ref 5–15)
BUN: 11 mg/dL (ref 6–20)
CO2: 19 mmol/L — ABNORMAL LOW (ref 22–32)
Calcium: 9.1 mg/dL (ref 8.9–10.3)
Chloride: 104 mmol/L (ref 98–111)
Creatinine, Ser: 0.69 mg/dL (ref 0.44–1.00)
GFR, Estimated: >60 mL/min (ref >60)
Glucose, Bld: 145 mg/dL — ABNORMAL HIGH (ref 70–99)
Potassium: 3.6 mmol/L (ref 3.5–5.1)
Sodium: 137 mmol/L (ref 135–145)
Total Bilirubin: 0.6 mg/dL (ref 0.3–1.2)
Total Protein: 8.2 g/dL — ABNORMAL HIGH (ref 6.5–8.1)

## 2020-04-05 LAB — CBC
HCT: 38.4 % (ref 36.0–46.0)
Hemoglobin: 12.5 g/dL (ref 12.0–15.0)
MCH: 25.5 pg — ABNORMAL LOW (ref 26.0–34.0)
MCHC: 32.6 g/dL (ref 30.0–36.0)
MCV: 78.4 fL — ABNORMAL LOW (ref 80.0–100.0)
Platelets: 184 10*3/uL (ref 150–400)
RBC: 4.9 MIL/uL (ref 3.87–5.11)
RDW: 16.1 % — ABNORMAL HIGH (ref 11.5–15.5)
WBC: 3.5 10*3/uL — ABNORMAL LOW (ref 4.0–10.5)
nRBC: 0 % (ref 0.0–0.2)

## 2020-04-05 LAB — LIPASE, BLOOD: Lipase: 23 U/L (ref 11–51)

## 2020-04-05 LAB — TROPONIN I (HIGH SENSITIVITY): Troponin I (High Sensitivity): 5 ng/L (ref <18)

## 2020-04-05 MED ORDER — ONDANSETRON 4 MG PO TBDP
4.0000 mg | ORAL_TABLET | Freq: Once | ORAL | Status: AC | PRN
  Administered 2020-04-05: 4 mg via ORAL
  Filled 2020-04-05: qty 1

## 2020-04-05 NOTE — ED Triage Notes (Signed)
Pt to ED reporting NV  with generalized body aches and chest tightness since yesterday. No cough or fevers and no known exposure to anyone with similar symptoms. No SOB.

## 2020-04-06 ENCOUNTER — Emergency Department
Admission: EM | Admit: 2020-04-06 | Discharge: 2020-04-06 | Disposition: A | Discharge status: Home / Self Care | Payer: Self-pay | Attending: Emergency Medicine | Admitting: Emergency Medicine

## 2020-04-06 ENCOUNTER — Emergency Department: Payer: Self-pay

## 2020-04-06 LAB — TROPONIN I (HIGH SENSITIVITY): Troponin I (High Sensitivity): 7 ng/L (ref <18)

## 2020-04-06 NOTE — ED Notes (Signed)
No answer when called several times from lobby 

## 2021-12-18 IMAGING — CR DG CHEST 2V
2 series · 2 of 2 positions shown · non-contrast
Comparison: 04/28/2013

CLINICAL DATA: Chest pain.

EXAM:
CHEST - 2 VIEW

[chest pa]
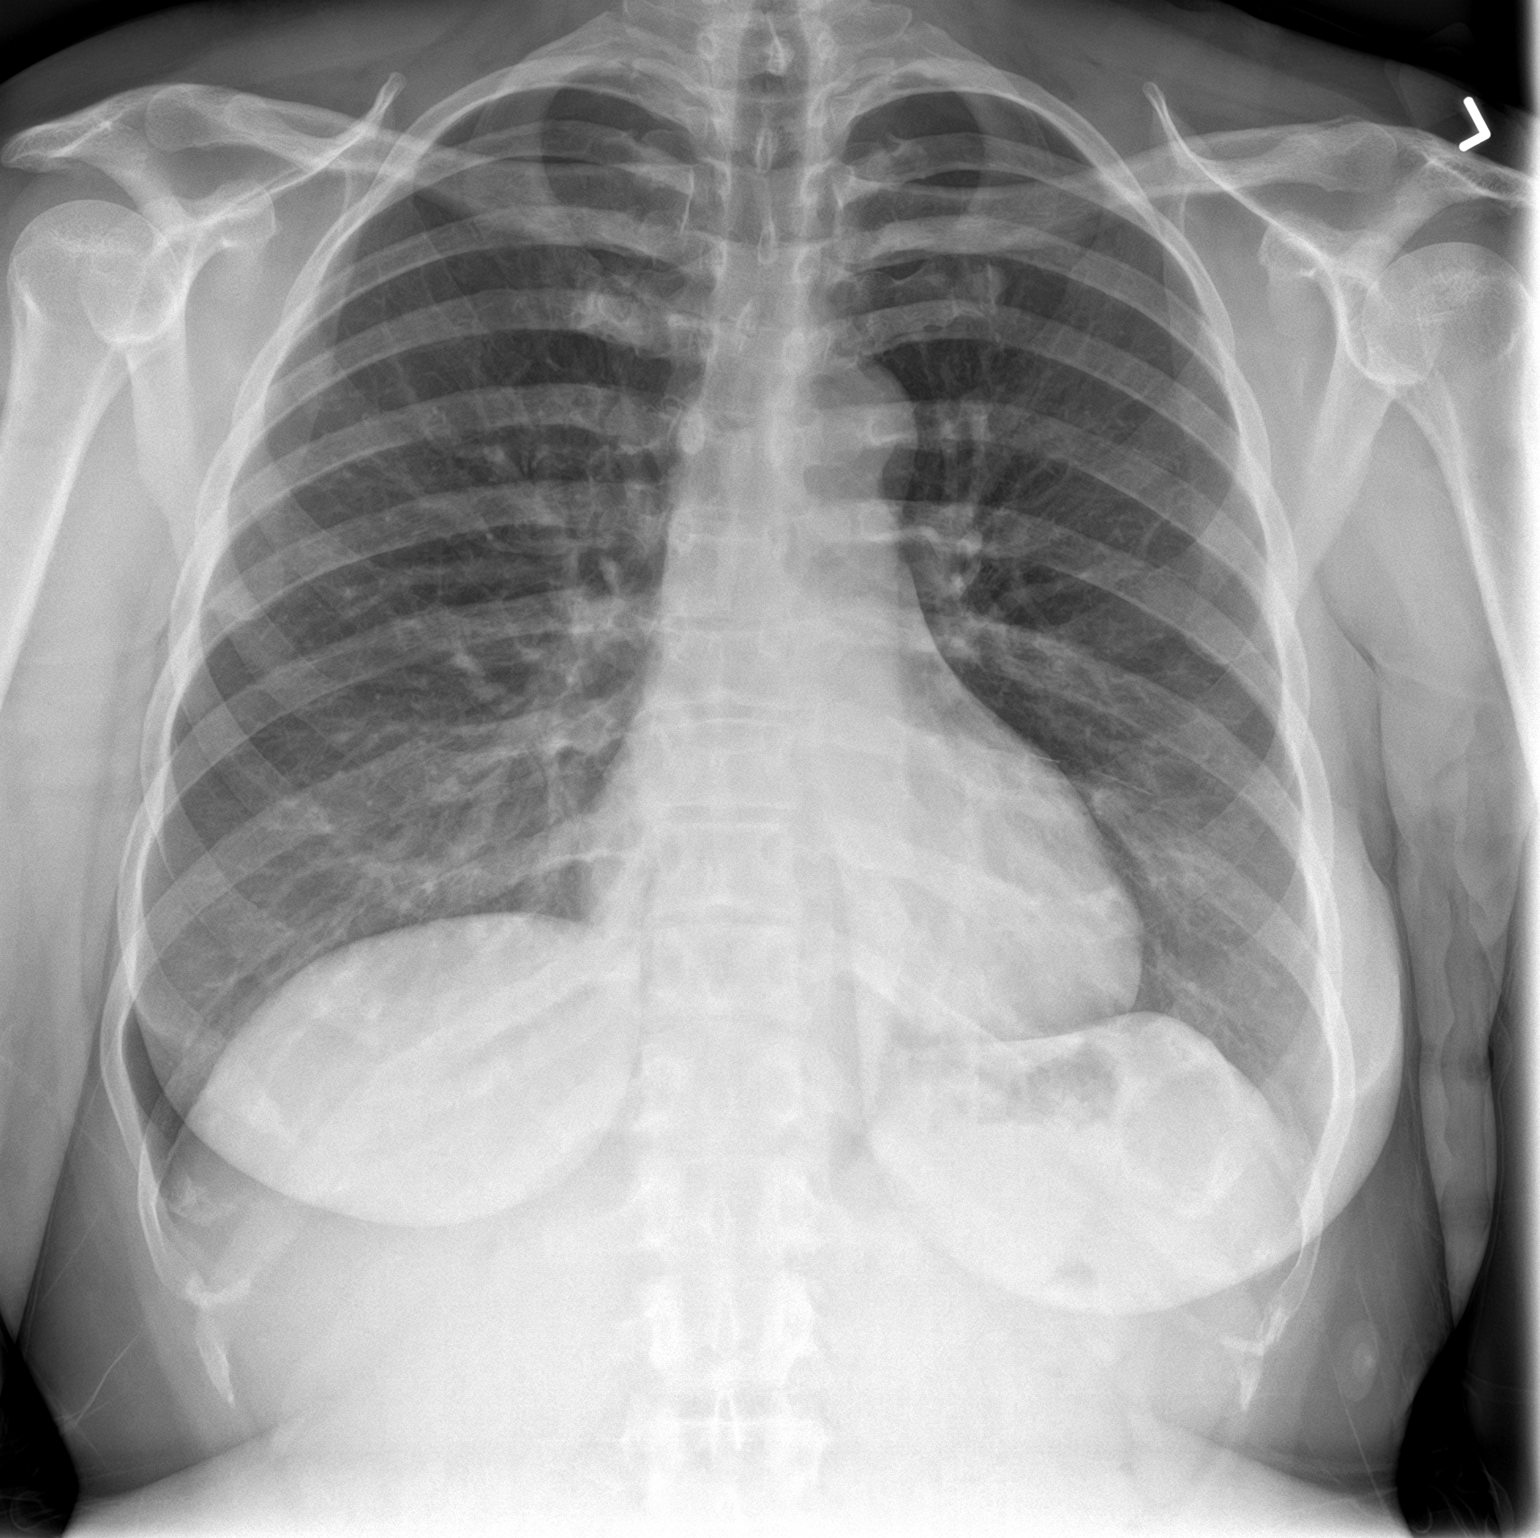

[chest lat]
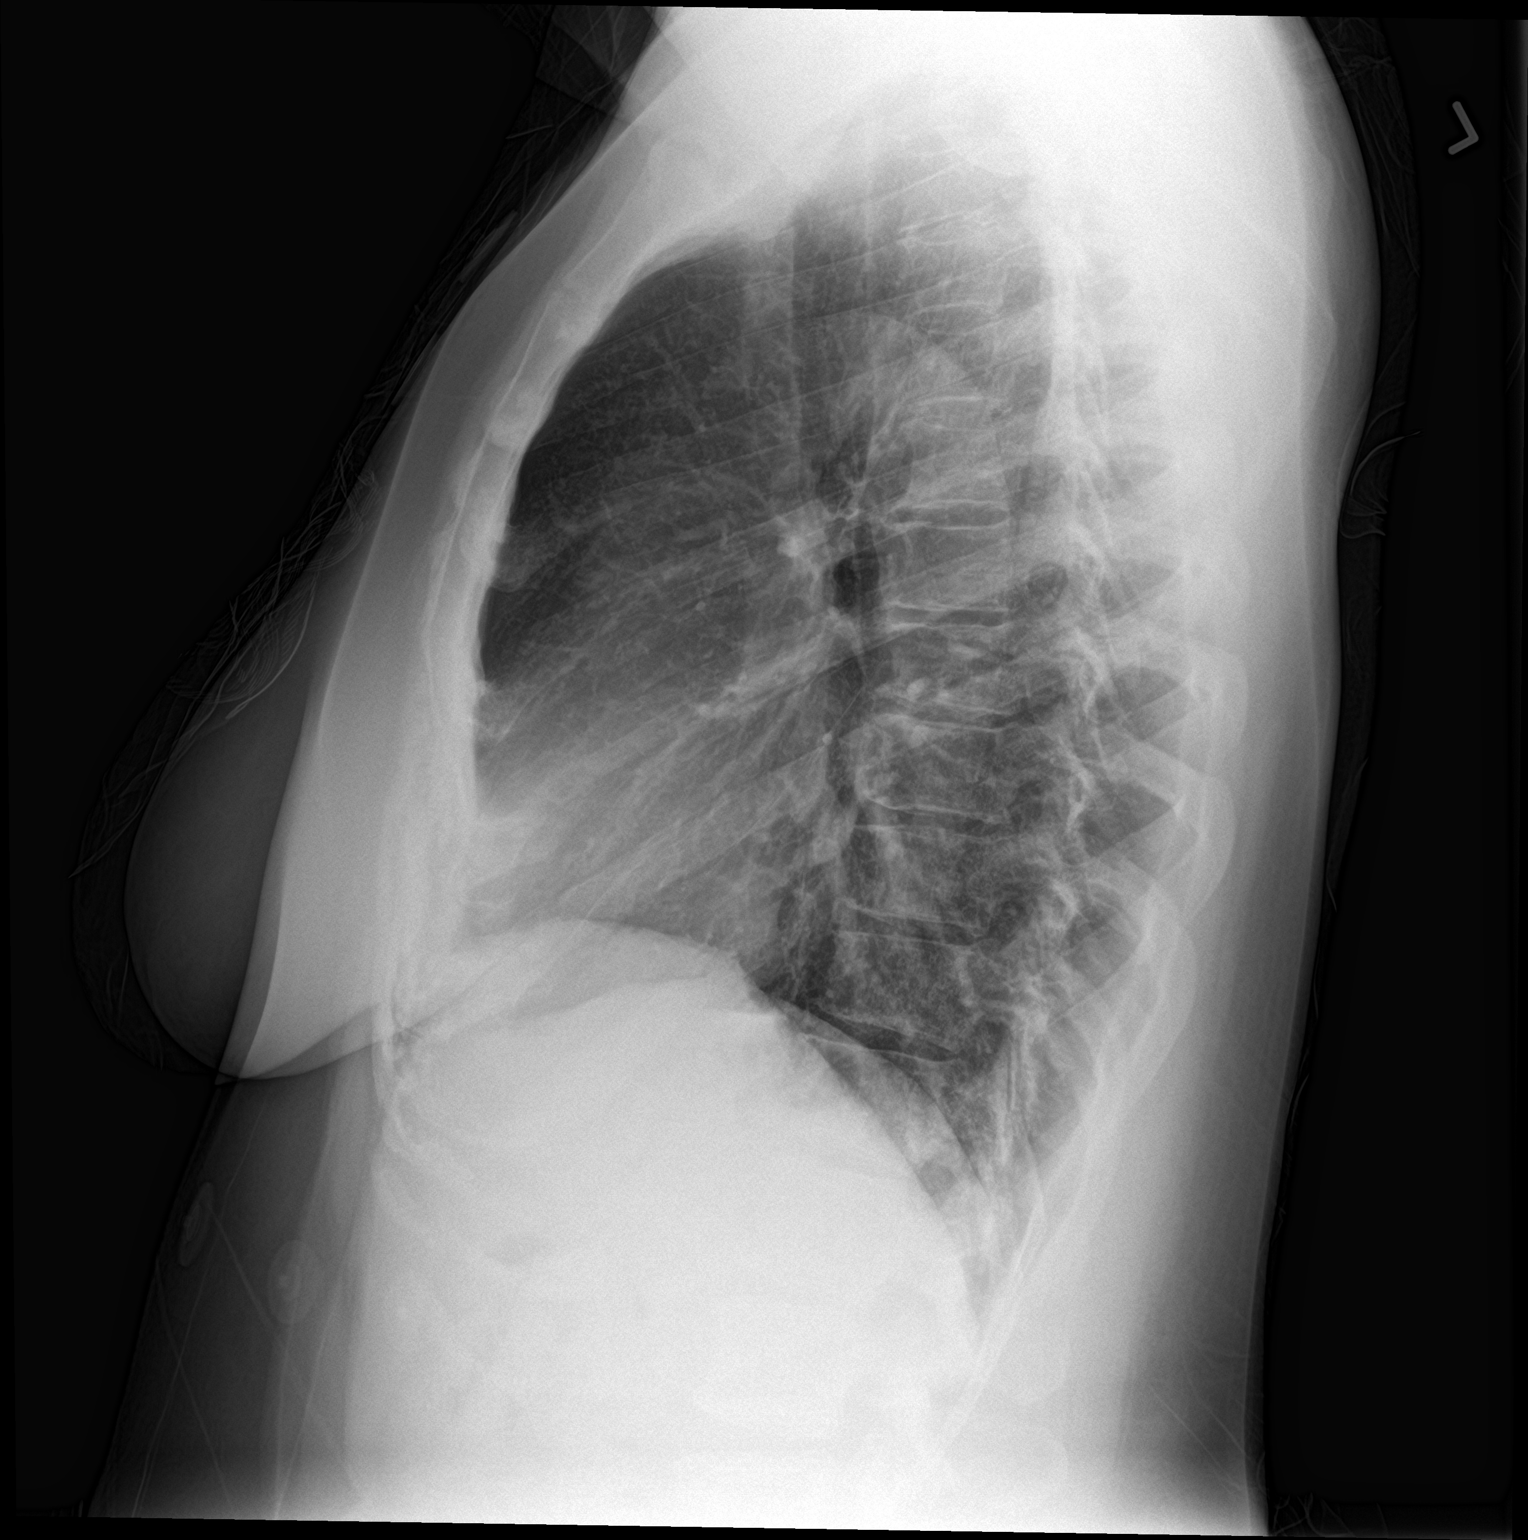

[2 of 2 positions shown; findings below may reference images not displayed]

FINDINGS: There are airspace opacities in the costophrenic angle only
visualized on the lateral view. The lungs are otherwise clear. There
is no pneumothorax. No large pleural effusion. The heart size is
unremarkable. There is no acute osseous abnormality.
IMPRESSION: Small airspace opacities in the costophrenic angle only visualized
on the lateral view may represent atelectasis or developing
infiltrates.

## 2022-07-24 ENCOUNTER — Encounter: Payer: Self-pay | Admitting: Intensive Care

## 2022-07-24 ENCOUNTER — Emergency Department
Admission: EM | Admit: 2022-07-24 | Discharge: 2022-07-24 | Disposition: A | Discharge status: Home / Self Care | Payer: Self-pay | Attending: Emergency Medicine | Admitting: Emergency Medicine

## 2022-07-24 ENCOUNTER — Other Ambulatory Visit: Payer: Self-pay

## 2022-07-24 ENCOUNTER — Emergency Department: Payer: Self-pay

## 2022-07-24 DIAGNOSIS — M79671 Pain in right foot: Secondary | ICD-10-CM

## 2022-07-24 DIAGNOSIS — X58XXXA Exposure to other specified factors, initial encounter: Secondary | ICD-10-CM | POA: Insufficient documentation

## 2022-07-24 DIAGNOSIS — Y9301 Activity, walking, marching and hiking: Secondary | ICD-10-CM | POA: Insufficient documentation

## 2022-07-24 DIAGNOSIS — M7989 Other specified soft tissue disorders: Secondary | ICD-10-CM | POA: Insufficient documentation

## 2022-07-24 DIAGNOSIS — M25571 Pain in right ankle and joints of right foot: Secondary | ICD-10-CM | POA: Insufficient documentation

## 2022-07-24 MED ORDER — TRAMADOL HCL 50 MG PO TABS
50.0000 mg | ORAL_TABLET | Freq: Four times a day (QID) | ORAL | 0 refills | Status: AC | PRN
Start: 2022-07-24 — End: 2023-07-24

## 2022-07-24 NOTE — ED Triage Notes (Signed)
Patient reports right ankle injury while walking yesterday. C/o right ankle pain and foot pain

## 2022-07-24 NOTE — ED Provider Notes (Signed)
   [facility] Provider Note    Event Date/Time   First MD Initiated Contact with Patient 07/24/22 0957     (approximate)   History   Ankle Injury   HPI  Victoria Hardy is a 42 y.o. female who presents with complaints of right ankle pain and foot pain.  Patient reports she injured her ankle yesterday while walking, she thinks she rolled it.  No other complaints      Physical Exam   Triage Vital Signs: ED Triage Vitals [07/24/22 0952]  Enc Vitals Group     BP (!) 168/113     Pulse Rate 69     Resp 16     Temp 99.1 F (37.3 C)     Temp Source Oral     SpO2 99 %     Weight 106.6 kg (235 lb)     Height 1.727 m (5\' 8" )     Head Circumference      Peak Flow      Pain Score 8     Pain Loc      Pain Edu?      Excl. in GC?     Most recent vital signs: Vitals:   07/24/22 0952  BP: (!) 168/113  Pulse: 69  Resp: 16  Temp: 99.1 F (37.3 C)  SpO2: 99%     General: Awake, no distress.  CV:  Good peripheral perfusion.  Resp:  Normal effort.  Abd:  No distention.  Other:  Right foot: Warm and well-perfused, no bony abnormalities palpated, mild lateral malleolus swelling, mild forefoot tenderness   ED Results / Procedures / Treatments   Labs (all labs ordered are listed, but only abnormal results are displayed) Labs Reviewed - No data to display   EKG     RADIOLOGY Ankle x-ray viewed interpreted by me, no fracture    PROCEDURES:  Critical Care performed:   Procedures   MEDICATIONS ORDERED IN ED: Medications - No data to display   IMPRESSION / MDM / ASSESSMENT AND PLAN / ED COURSE  I reviewed the triage vital signs and the nursing notes. Patient's presentation is most consistent with acute complicated illness / injury requiring diagnostic workup.   Patient presents after ankle injury as above, differential includes sprain versus fracture  Radiology notes avulsion injury, splint applied, crutches provided, recommend  RICE, analgesic prescription given.  Outpatient follow-up with podiatry       FINAL CLINICAL IMPRESSION(S) / ED DIAGNOSES   Final diagnoses:  Foot pain, right     Rx / DC Orders   ED Discharge Orders          Ordered    traMADol (ULTRAM) 50 MG tablet  Every 6 hours PRN        07/24/22 1113             Note:  This document was prepared using Dragon voice recognition software and may include unintentional dictation errors.   Jene Every, MD 07/24/22 1121

## 2023-03-01 ENCOUNTER — Encounter: Payer: Self-pay | Admitting: Radiology

## 2023-03-01 ENCOUNTER — Other Ambulatory Visit: Payer: Self-pay

## 2023-03-01 ENCOUNTER — Emergency Department
Admission: EM | Admit: 2023-03-01 | Discharge: 2023-03-01 | Disposition: A | Discharge status: Home / Self Care | Payer: Self-pay | Attending: Emergency Medicine | Admitting: Emergency Medicine

## 2023-03-01 DIAGNOSIS — M791 Myalgia, unspecified site: Secondary | ICD-10-CM | POA: Insufficient documentation

## 2023-03-01 DIAGNOSIS — J069 Acute upper respiratory infection, unspecified: Secondary | ICD-10-CM | POA: Insufficient documentation

## 2023-03-01 DIAGNOSIS — Z20822 Contact with and (suspected) exposure to covid-19: Secondary | ICD-10-CM | POA: Insufficient documentation

## 2023-03-01 LAB — RESP PANEL BY RT-PCR (RSV, FLU A&B, COVID)  RVPGX2
Influenza A by PCR: NEGATIVE
Influenza B by PCR: NEGATIVE
Resp Syncytial Virus by PCR: NEGATIVE
SARS Coronavirus 2 by RT PCR: NEGATIVE

## 2023-03-01 LAB — GROUP A STREP BY PCR: Group A Strep by PCR: NOT DETECTED

## 2023-03-01 NOTE — ED Provider Notes (Signed)
[facility] Provider Note    Event Date/Time   First MD Initiated Contact with Patient 03/01/23 1326     (approximate)   History   Sore Throat and Facial Pain   HPI  Victoria Hardy is a 42 y.o. female who presents today for evaluation of sore throat, nasal congestion, and bodyaches for the past 2 days.  Patient reports that this began after going to her daughter's basketball game.  She denies fevers or chills.  No chest pain, shortness breath, abdominal pain, nausea, vomiting, or diarrhea.  There are no problems to display for this patient.         Physical Exam   Triage Vital Signs: ED Triage Vitals  Encounter Vitals Group     BP 03/01/23 1225 (!) 177/112     Systolic BP Percentile --      Diastolic BP Percentile --      Pulse Rate 03/01/23 1225 91     Resp 03/01/23 1225 18     Temp 03/01/23 1225 97.9 F (36.6 C)     Temp Source 03/01/23 1225 Oral     SpO2 03/01/23 1225 97 %     Weight 03/01/23 1222 235 lb (106.6 kg)     Height 03/01/23 1222 5\' 8"  (1.727 m)     Head Circumference --      Peak Flow --      Pain Score --      Pain Loc --      Pain Education --      Exclude from Growth Chart --     Most recent vital signs: Vitals:   03/01/23 1225  BP: (!) 177/112  Pulse: 91  Resp: 18  Temp: 97.9 F (36.6 C)  SpO2: 97%    Physical Exam Vitals and nursing note reviewed.  Constitutional:      General: Awake and alert. No acute distress.    Appearance: Normal appearance. The patient is normal weight.  HENT:     Head: Normocephalic and atraumatic.     Mouth: Mucous membranes are moist. Uvula midline.  No tonsillar exudate.  No soft palate fluctuance.  No trismus.  No voice change.  No sublingual swelling.  No tender cervical lymphadenopathy.  No nuchal rigidity Eyes:     General: PERRL. Normal EOMs        Right eye: No discharge.        Left eye: No discharge.     Conjunctiva/sclera: Conjunctivae normal.  Cardiovascular:      Rate and Rhythm: Normal rate and regular rhythm.     Pulses: Normal pulses.  Pulmonary:     Effort: Pulmonary effort is normal. No respiratory distress.     Breath sounds: Normal breath sounds.  Abdominal:     Abdomen is soft. There is no abdominal tenderness. No rebound or guarding. No distention. Musculoskeletal:        General: No swelling. Normal range of motion.     Cervical back: Normal range of motion and neck supple.  Skin:    General: Skin is warm and dry.     Capillary Refill: Capillary refill takes less than 2 seconds.     Findings: No rash.  Neurological:     Mental Status: The patient is awake and alert.      ED Results / Procedures / Treatments   Labs (all labs ordered are listed, but only abnormal results are displayed) Labs Reviewed  GROUP A STREP BY PCR  RESP PANEL BY RT-PCR (RSV, FLU A&B, COVID)  [id number]     EKG     RADIOLOGY     PROCEDURES:  Critical Care performed:   Procedures   MEDICATIONS ORDERED IN ED: Medications - No data to display   IMPRESSION / MDM / ASSESSMENT AND PLAN / ED COURSE  I reviewed the triage vital signs and the nursing notes.   Differential diagnosis includes, but is not limited to, COVID, flu, RSV, strep pharyngitis, other viral URI.  Patient is awake and alert, hemodynamically stable and afebrile.  She is nontoxic in appearance.  She has mild oropharyngeal erythema without tonsillar exudate, uvula is midline, no trismus, no drooling, no voice change, do not suspect peritonsillar or retropharyngeal abscess.  She is nontoxic in appearance.  Lungs are clear to auscultation bilaterally, normal oxygen saturation on room air, no fever, I do not suspect pneumonia.  Discussed symptomatic management and return precautions.  Patient understands and agrees with plan.  She was discharged in stable condition.   Patient's presentation is most consistent with acute complicated illness / injury requiring diagnostic workup.       FINAL CLINICAL IMPRESSION(S) / ED DIAGNOSES   Final diagnoses:  Upper respiratory tract infection, unspecified type     Rx / DC Orders   ED Discharge Orders     None        Note:  This document was prepared using Dragon voice recognition software and may include unintentional dictation errors.   Jackelyn Hoehn, PA-C 03/01/23 1450    Janith Lima, MD 03/02/23 0700

## 2023-03-01 NOTE — ED Triage Notes (Signed)
Pt states Thursday she started having a scratchy throat but yesterday her throat was hurting worse and her face hurts. Pt states she has had sinus infections in the past. Denies  cough congestion. States she did take allegra yesterday without improvement.

## 2023-03-01 NOTE — Discharge Instructions (Signed)
Your Covid, Flu, RSV, and strep test were negative.  You may continue to take Tylenol/ibuprofen per package instructions to help with your symptoms.  Return for any new, worsening, or change in symptoms or other concerns.  It was a pleasure caring for you today.

## 2023-03-01 NOTE — ED Triage Notes (Signed)
Pt BP is elevated. PT states she takes lisinopril and has not taken it today.
